# Patient Record
Sex: Male | Born: 1990 | Race: White | Hispanic: No | Marital: Single | Smoking: Never smoker
Health system: Southern US, Community
[De-identification: ages and names within clinical notes are randomized; demographics above are authoritative.]

---

## 2020-09-25 ENCOUNTER — Emergency Department (HOSPITAL_COMMUNITY)
Admission: EM | Admit: 2020-09-25 | Discharge: 2020-09-26 | Disposition: A | Discharge status: Home / Self Care | Payer: Managed Care, Other (non HMO) | Attending: Emergency Medicine | Admitting: Emergency Medicine

## 2020-09-25 ENCOUNTER — Emergency Department (HOSPITAL_COMMUNITY): Payer: Managed Care, Other (non HMO)

## 2020-09-25 ENCOUNTER — Other Ambulatory Visit: Payer: Self-pay

## 2020-09-25 ENCOUNTER — Encounter (HOSPITAL_COMMUNITY): Payer: Self-pay | Admitting: Emergency Medicine

## 2020-09-25 DIAGNOSIS — W231XXA Caught, crushed, jammed, or pinched between stationary objects, initial encounter: Secondary | ICD-10-CM | POA: Insufficient documentation

## 2020-09-25 DIAGNOSIS — S6992XA Unspecified injury of left wrist, hand and finger(s), initial encounter: Secondary | ICD-10-CM | POA: Diagnosis present

## 2020-09-25 DIAGNOSIS — S62635A Displaced fracture of distal phalanx of left ring finger, initial encounter for closed fracture: Secondary | ICD-10-CM | POA: Diagnosis not present

## 2020-09-25 DIAGNOSIS — S68119A Complete traumatic metacarpophalangeal amputation of unspecified finger, initial encounter: Secondary | ICD-10-CM

## 2020-09-25 DIAGNOSIS — S62639B Displaced fracture of distal phalanx of unspecified finger, initial encounter for open fracture: Secondary | ICD-10-CM

## 2020-09-25 MED ORDER — CEPHALEXIN 250 MG PO CAPS
500.0000 mg | ORAL_CAPSULE | Freq: Once | ORAL | Status: AC
  Administered 2020-09-26: 500 mg via ORAL
  Filled 2020-09-25: qty 2

## 2020-09-25 MED ORDER — LIDOCAINE HCL 2 % IJ SOLN
10.0000 mL | Freq: Once | INTRAMUSCULAR | Status: AC
  Administered 2020-09-26: 200 mg
  Filled 2020-09-25: qty 20

## 2020-09-25 MED ORDER — ACETAMINOPHEN 500 MG PO TABS
1000.0000 mg | ORAL_TABLET | Freq: Once | ORAL | Status: AC
  Administered 2020-09-26: 1000 mg via ORAL
  Filled 2020-09-25: qty 2

## 2020-09-25 NOTE — ED Triage Notes (Signed)
Pt got L ring finger stuck between 2 pieces of metal, pain 6/10, bleeding controlled.

## 2020-09-25 NOTE — ED Provider Notes (Signed)
Emergency Medicine Provider Triage Evaluation Note  Russell Schultz , a 30 y.o. male  was evaluated in triage.  Pt complains of laceration to the volar aspect of the distal left middle finger after his finger got pinched between 2 pieces of metal when loading a golf cart.  Incident happened approximately 1 and half hours prior to arrival to the ED.  No history of bleeding or clotting disorder.  He is up-to-date on his tetanus vaccine approximate 2 years ago.  He does have the amputated portion of the pad of the finger and a bag of saline ice with him in ED.  Review of Systems  Positive: Laceration to the left distal ring finger Negative: Fevers, chills, bleeding or clotting disorder  Physical Exam  BP 124/75 (BP Location: Right Arm)   Pulse 65   Temp 98.3 F (36.8 C) (Oral)   Resp 19   SpO2 97%  Gen:   Awake, no distress   Resp:  Normal effort  MSK:   Moves extremities without difficulty  Other:  Amputation of the soft tissue of the volar aspect of the left distal ring finger, with oozing of blood from the wound.  Macerated soft tissue visible, not able to visualize bone.  Nailbed is intact proximally.  Medical Decision Making  Medically screening exam initiated at 6:40 PM.  Appropriate orders placed.  Jonn Chaikin was informed that the remainder of the evaluation will be completed by another provider, this initial triage assessment does not replace that evaluation, and the importance of remaining in the ED until their evaluation is complete.  This chart was dictated using voice recognition software, Dragon. Despite the best efforts of this provider to proofread and correct errors, errors may still occur which can change documentation meaning.    Sherrilee Gilles 09/25/20 1846    Koleen Distance, MD 09/25/20 2009

## 2020-09-25 NOTE — ED Provider Notes (Signed)
[facility] EMERGENCY DEPARTMENT Provider Note   CSN: [id number] Arrival date & time: 09/25/20  1721     History Chief Complaint  Patient presents with   Finger Injury    Russell Schultz is a 30 y.o. male.   30 year old male presents to the emergency department for evaluation of left ring finger injury.  Injury occurred around 1700 tonight.  He got his finger pinched between 2 pieces of metal when loading a golf cart.  Reports constant pain to the affected finger as well as persistent bleeding.  His tetanus is up-to-date.  No history of bleeding or clotting disorders.  No medications taken prior to arrival.  Was ordered to receive Tylenol in triage.  The history is provided by the patient. No language interpreter was used.      History reviewed. No pertinent past medical history.  There are no problems to display for this patient.   History reviewed. No pertinent surgical history.     History reviewed. No pertinent family history.  Social History   Tobacco Use   Smoking status: Never   Smokeless tobacco: Never  Substance Use Topics   Alcohol use: Yes    Comment: occasionally   Drug use: Yes    Types: Marijuana    Comment: occasional    Home Medications Prior to Admission medications   Medication Sig Start Date End Date Taking? Authorizing Provider  cephALEXin (KEFLEX) 500 MG capsule Take 1 capsule (500 mg total) by mouth 4 (four) times daily for 7 days. 09/26/20 10/03/20 Yes Antony Madura, PA-C    Allergies    Cat hair extract  Review of Systems   Review of Systems Ten systems reviewed and are negative for acute change, except as noted in the HPI.    Physical Exam Updated Vital Signs BP (!) 147/87   Pulse 61   Temp 98.8 F (37.1 C) (Oral)   Resp 16   Ht 5\' 11"  (1.803 m)   Wt 80.7 kg   SpO2 96%   BMI 24.83 kg/m   Physical Exam Vitals and nursing note reviewed.  Constitutional:      General: He is not in acute distress.     Appearance: He is well-developed. He is not diaphoretic.     Comments: Nontoxic appearing and in NAD  HENT:     Head: Normocephalic and atraumatic.  Eyes:     General: No scleral icterus.    Conjunctiva/sclera: Conjunctivae normal.  Cardiovascular:     Rate and Rhythm: Normal rate and regular rhythm.     Pulses: Normal pulses.  Pulmonary:     Effort: Pulmonary effort is normal. No respiratory distress.     Comments: Respirations even and unlabored Musculoskeletal:     Cervical back: Normal Schultz of motion.     Comments: Distal tip amputation of the L ring finger with persistent oozing. No palpable, pulsatile bleeding. Nail body and nail matrix/eponychium intact.   Skin:    General: Skin is warm and dry.     Coloration: Skin is not pale.     Findings: No erythema or rash.  Neurological:     Mental Status: He is alert and oriented to person, place, and time.  Psychiatric:        Behavior: Behavior normal.    ED Results / Procedures / Treatments   Labs (all labs ordered are listed, but only abnormal results are displayed) Labs Reviewed - No data to display  EKG None  Radiology DG Finger  Ring Left  Result Date: 09/25/2020 CLINICAL DATA:  Finger injury with deep laceration. EXAM: LEFT RING FINGER 2+V COMPARISON:  None. FINDINGS: Prominent skin and soft tissue irregularity about the distal aspect of the digit with distal tuft fracture and irregularity. No extension to the articular surface. Proximal digit is intact. Normal alignment. Overlying dressing in place without obvious radiopaque foreign body. IMPRESSION: Distal tuft fracture with prominent skin and soft tissue irregularity consistent with laceration. No intra-articular extension or radiopaque foreign body. Electronically Signed   By: Narda Rutherford M.D.   On: 09/25/2020 19:39    Procedures Wound repair  Date/Time: 09/26/2020 1:07 AM Performed by: Antony Madura, PA-C Authorized by: Antony Madura, PA-C  Consent: The  procedure was performed in an emergent situation. Verbal consent obtained. Risks and benefits: risks, benefits and alternatives were discussed Consent given by: patient Required items: required blood products, implants, devices, and special equipment available Patient identity confirmed: verbally with patient and arm band Time out: Immediately prior to procedure a "time out" was called to verify the correct patient, procedure, equipment, support staff and site/side marked as required. Local anesthesia used: yes Anesthesia: digital block  Anesthesia: Local anesthesia used: yes Local Anesthetic: lidocaine 2% without epinephrine Anesthetic total: 5 mL Patient tolerance: patient tolerated the procedure well with no immediate complications Comments: Hemostasis of L ring finger distal tip amputation. Wound irrigated with 650cc and dressed with Adaptix, gauze, Coband.   Marland Kitchen.Laceration Repair  Date/Time: 09/26/2020 1:09 AM Performed by: Antony Madura, PA-C Authorized by: Antony Madura, PA-C   Consent:    Consent obtained:  Verbal and emergent situation   Consent given by:  Patient   Risks, benefits, and alternatives were discussed: yes     Risks discussed:  Poor cosmetic result and pain   Alternatives discussed:  No treatment Universal protocol:    Procedure explained and questions answered to patient or proxy's satisfaction: yes     Relevant documents present and verified: yes     Imaging studies available: yes     Patient identity confirmed:  Verbally with patient and arm band Anesthesia:    Anesthesia method:  Local infiltration   Local anesthetic:  Lidocaine 2% w/o epi Laceration details:    Location:  Finger   Finger location:  L ring finger Exploration:    Hemostasis achieved with:  Tied off vessels   Imaging obtained: x-ray     Imaging outcome: foreign body not noted   Treatment:    Area cleansed with:  Povidone-iodine   Irrigation solution:  Sterile water   Irrigation  volume:  650   Irrigation method:  Pressure wash   Debridement:  None Skin repair:    Repair method:  Sutures   Suture size:  5-0   Suture material:  Chromic gut   Suture technique:  Figure eight   Number of sutures:  3 Repair type:    Repair type:  Simple Comments:     Figure 8 sutures for hemostasis of distal tip avulsion/amputation   Medications Ordered in ED Medications  acetaminophen (TYLENOL) tablet 1,000 mg (1,000 mg Oral Given 09/26/20 0122)  lidocaine (XYLOCAINE) 2 % (with pres) injection 200 mg (200 mg Infiltration Given 09/26/20 0122)  cephALEXin (KEFLEX) capsule 500 mg (500 mg Oral Given 09/26/20 0008)    ED Course  I have reviewed the triage vital signs and the nursing notes.  Pertinent labs & imaging results that were available during my care of the patient were reviewed by me and considered  in my medical decision making (see chart for details).  Clinical Course as of 09/26/20 0310  Wynelle Link Sep 25, 2020  2112 Consult to hand surgeon, Dr. Eulah Pont regarding open tuft fracture.  He recommends extensive irrigation of the wound, Adaptix dressing (NOT xeroform) and follow up with the wound clinic for dressings. Will require abx for open fracture.  [RS]  Mon Sep 26, 2020  0115 No change to dressing; hemostasis achieved. Plan for splint and discharge. [KH]    Clinical Course User Index [KH] Antony Madura, PA-C [RS] Sponseller, Idelia Salm   MDM Rules/Calculators/A&P                          30 year old male presents to the emergency department after getting his left ring finger caught between 2 pieces of metal.  This resulted in traumatic amputation to the distal fat pad of the digit.  There was slow, persistent bleeding which originated from a small vessel.  Bleeding was controlled with figure-of-eight sutures.  Subsequently underwent copious irrigation with 650cc sterile water.  Wound was dressed and patient ordered to be placed in finger splint. Will d/c on course of Keflex  and refer to wound care to ensure proper healing. Return precautions discussed and provided. Patient discharged in stable condition with no unaddressed concerns.   Final Clinical Impression(s) / ED Diagnoses Final diagnoses:  Traumatic amputation of tip of finger, initial encounter  Open fracture of tuft of distal phalanx of finger    Rx / DC Orders ED Discharge Orders          Ordered    Ambulatory referral to Wound Clinic       Comments: Traumatic fingertip avulsion/amputation   09/26/20 0129    cephALEXin (KEFLEX) 500 MG capsule  4 times daily        09/26/20 0309             Antony Madura, PA-C 09/26/20 9826    Zadie Rhine, MD 09/26/20 [phone]

## 2020-09-26 MED ORDER — CEPHALEXIN 500 MG PO CAPS: 500.0000 mg | ORAL_CAPSULE | Freq: Four times a day (QID) | ORAL | 0 refills | Status: AC

## 2020-09-26 NOTE — Discharge Instructions (Addendum)
Wait 48 hours and then change her dressing at least once per day to keep the area clean and dry.  You should first apply a strip of a nonstick/nonadherent dressing followed by 1 piece of gauze.  These can be secured with Coband.  Be sure not to limit blood supply to the fingertip by wrapping the bandage too tightly.  Reapply your splint to limit movement of the finger.  We advise wound care follow-up within 1 week.  You may contact your primary care doctor for a referral, if needed.  Take Tylenol or ibuprofen for pain.  Return to the ED for new or concerning symptoms.

## 2022-05-23 IMAGING — DX DG FINGER RING 2+V*L*
3 series · 3 of 3 positions shown · non-contrast
Comparison: None.

CLINICAL DATA: Finger injury with deep laceration.

EXAM:
LEFT RING FINGER 2+V

[finger ap]
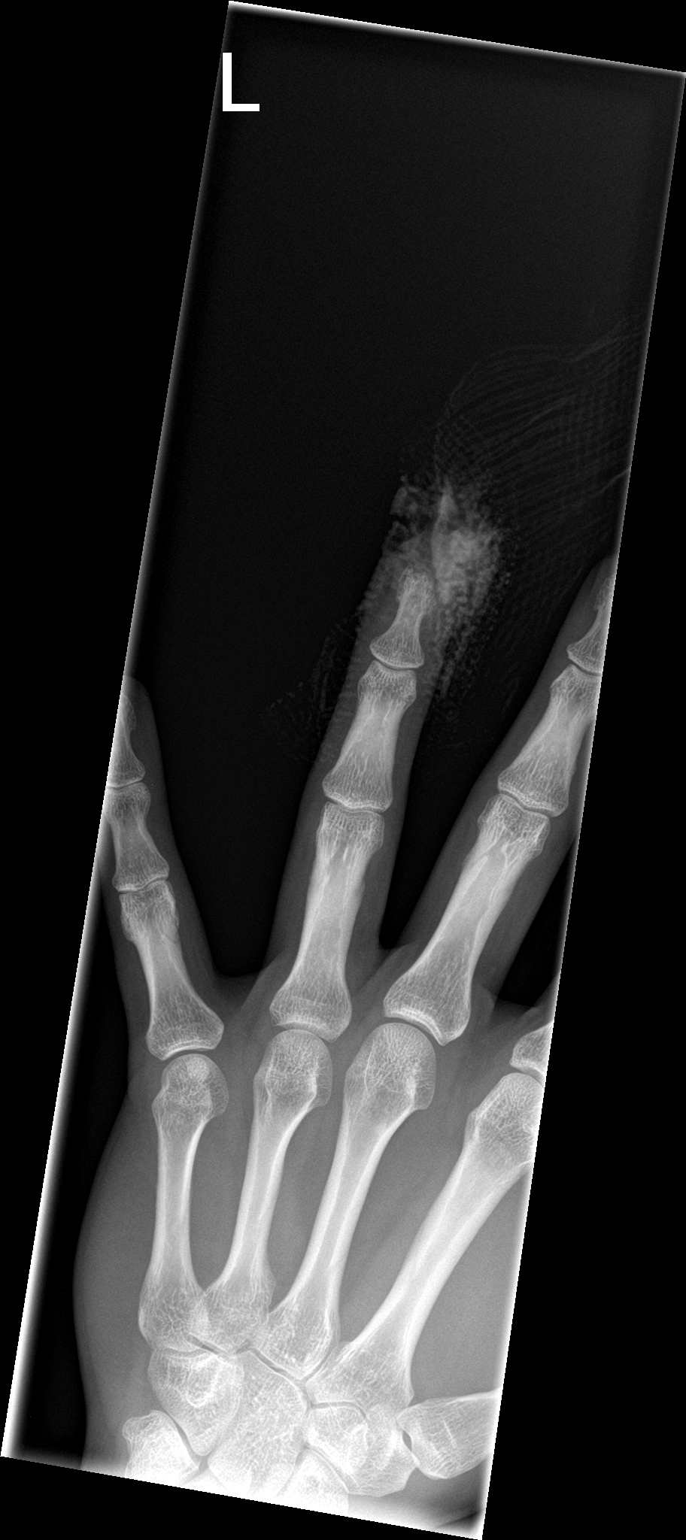

[finger obl]
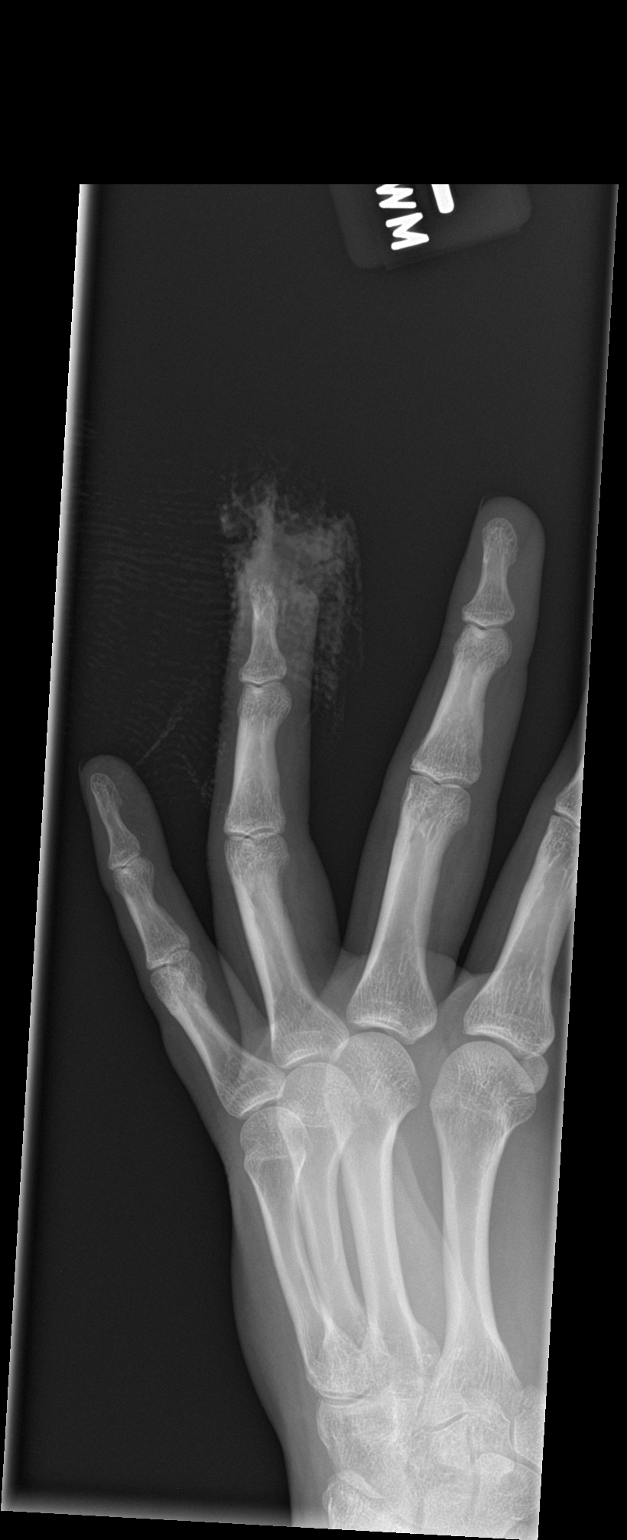

[finger lat]
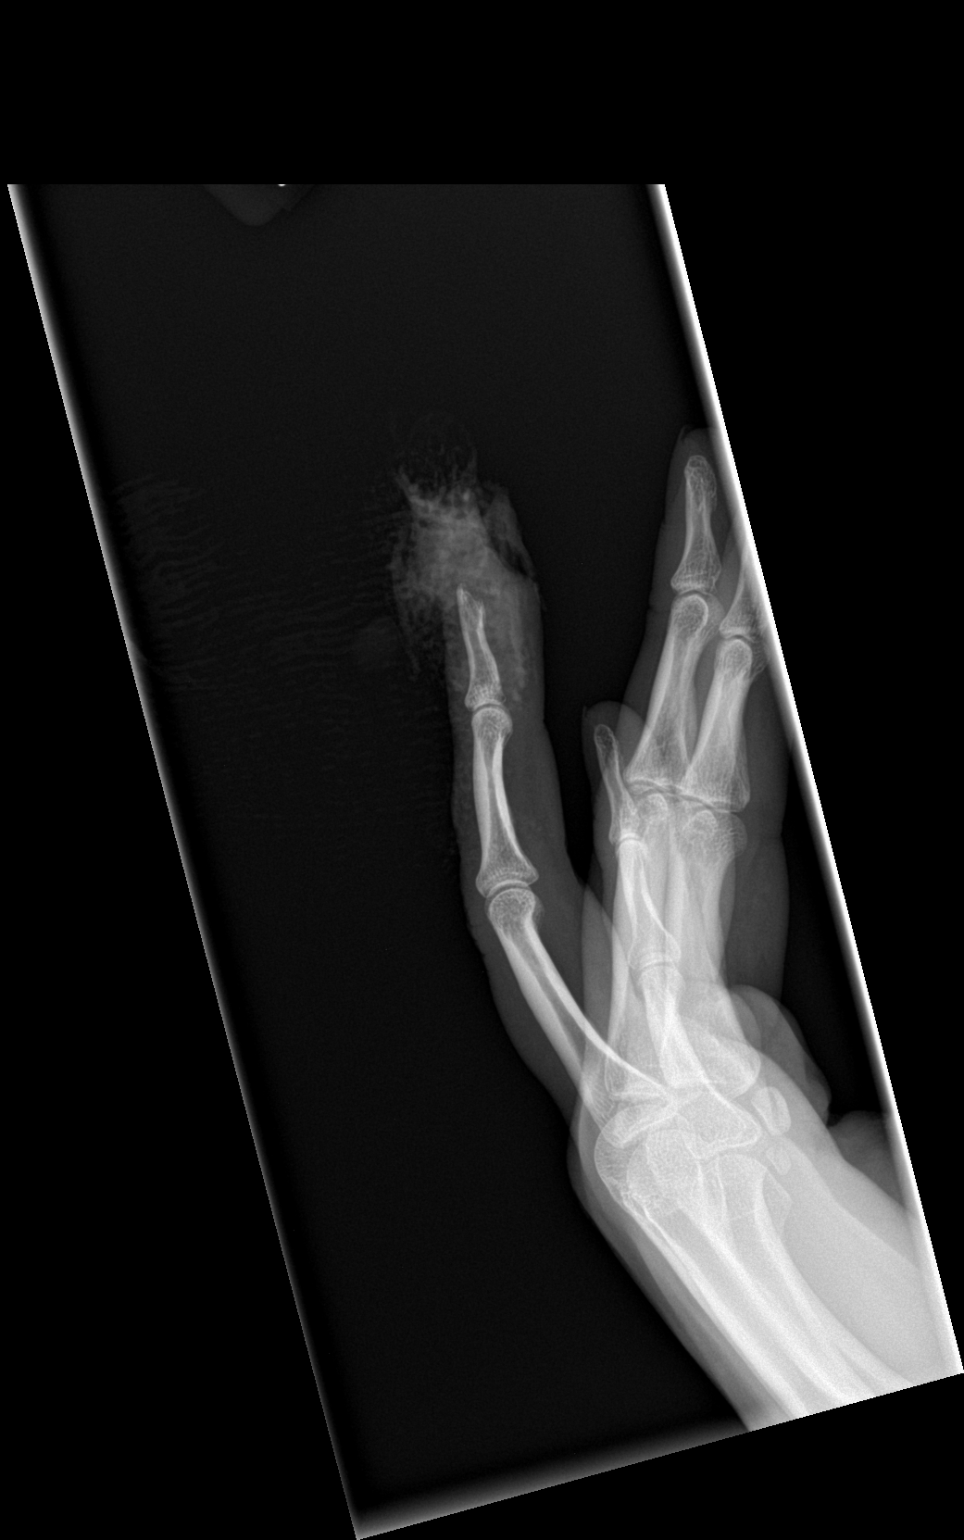

[3 of 3 positions shown; findings below may reference images not displayed]

FINDINGS: Prominent skin and soft tissue irregularity about the distal aspect
of the digit with distal tuft fracture and irregularity. No
extension to the articular surface. Proximal digit is intact. Normal
alignment. Overlying dressing in place without obvious radiopaque
foreign body.
IMPRESSION: Distal tuft fracture with prominent skin and soft tissue
irregularity consistent with laceration. No intra-articular
extension or radiopaque foreign body.
